# Patient Record
Sex: Female | Born: 1995 | Race: Black or African American | Hispanic: No | Marital: Single
Health system: Southern US, Community
[De-identification: ages and names within clinical notes are randomized; demographics above are authoritative.]

---

## 2018-05-04 ENCOUNTER — Emergency Department (HOSPITAL_COMMUNITY): Payer: BLUE CROSS/BLUE SHIELD

## 2018-05-04 ENCOUNTER — Emergency Department (HOSPITAL_COMMUNITY)
Admission: EM | Admit: 2018-05-04 | Discharge: 2018-05-04 | Disposition: A | Discharge status: Home / Self Care | Payer: BLUE CROSS/BLUE SHIELD | Attending: Emergency Medicine | Admitting: Emergency Medicine

## 2018-05-04 ENCOUNTER — Encounter (HOSPITAL_COMMUNITY): Payer: Self-pay

## 2018-05-04 DIAGNOSIS — Y999 Unspecified external cause status: Secondary | ICD-10-CM | POA: Diagnosis not present

## 2018-05-04 DIAGNOSIS — Y93B3 Activity, free weights: Secondary | ICD-10-CM | POA: Insufficient documentation

## 2018-05-04 DIAGNOSIS — W208XXA Other cause of strike by thrown, projected or falling object, initial encounter: Secondary | ICD-10-CM | POA: Diagnosis not present

## 2018-05-04 DIAGNOSIS — S62653A Nondisplaced fracture of medial phalanx of left middle finger, initial encounter for closed fracture: Secondary | ICD-10-CM

## 2018-05-04 DIAGNOSIS — Z23 Encounter for immunization: Secondary | ICD-10-CM | POA: Diagnosis not present

## 2018-05-04 DIAGNOSIS — Y9239 Other specified sports and athletic area as the place of occurrence of the external cause: Secondary | ICD-10-CM | POA: Diagnosis not present

## 2018-05-04 DIAGNOSIS — S62663A Nondisplaced fracture of distal phalanx of left middle finger, initial encounter for closed fracture: Secondary | ICD-10-CM

## 2018-05-04 DIAGNOSIS — S6992XA Unspecified injury of left wrist, hand and finger(s), initial encounter: Secondary | ICD-10-CM | POA: Diagnosis present

## 2018-05-04 MED ORDER — OXYCODONE-ACETAMINOPHEN 5-325 MG PO TABS: 2.0000 | ORAL_TABLET | ORAL | 0 refills | Status: AC | PRN

## 2018-05-04 MED ORDER — OXYCODONE-ACETAMINOPHEN 5-325 MG PO TABS
1.0000 | ORAL_TABLET | ORAL | Status: AC | PRN
  Administered 2018-05-04 (×2): 1 via ORAL
  Filled 2018-05-04 (×2): qty 1

## 2018-05-04 MED ORDER — TETANUS-DIPHTH-ACELL PERTUSSIS 5-2.5-18.5 LF-MCG/0.5 IM SUSP
0.5000 mL | Freq: Once | INTRAMUSCULAR | Status: AC
  Administered 2018-05-04: 0.5 mL via INTRAMUSCULAR
  Filled 2018-05-04: qty 0.5

## 2018-05-04 NOTE — ED Notes (Signed)
Patient verbalizes understanding of discharge instructions. Opportunity for questioning and answers were provided. Armband removed by staff, pt discharged from ED.  

## 2018-05-04 NOTE — Discharge Instructions (Addendum)
Please read and follow all provided instructions.  You have been seen today for for fractures to your 3rd finger.  We have placed you in a splint for the fracture, please keep the splint on at all times until you have followed up with a hand surgeon provided in your discharge instructions, please follow-up within the next 3 to 5 days.  Please follow the below instructions in the meantime.  Home care instructions: -- *PRICE in the first 24-48 hours after injury: Protect (with brace, splint, sling), if given by your provider Rest Ice- Do not apply ice pack directly to your skin, place towel or similar between your skin and ice/ice pack. Apply ice for 20 min, then remove for 40 min while awake Compression- Wear brace, elastic bandage, splint as directed by your provider Elevate affected extremity above the level of your heart when not walking around for the first 24-48 hours   Please also keep your wound areas clean.  Medications:  Take ibuprofen 600 mg every 6 hours as needed for pain/swelling.  Your pain is not alleviated by the ibuprofen we have given you a prescription for Percocet. -Percocet-this is a narcotic/controlled substance medication that has potential addicting qualities.  We recommend that you take 1-2 tablets every 6 hours as needed for severe pain.  Do not drive or operate heavy machinery when taking this medicine as it can be sedating. Do not drink alcohol or take other sedating medications when taking this medicine for safety reasons.  Keep this out of reach of small children.  Please be aware this medicine has Tylenol in it (325 mg/tab) do not exceed the maximum dose of Tylenol in a day per over the counter recommendations should you decide to supplement with Tylenol over the counter.   We have prescribed you new medication(s) today. Discuss the medications prescribed today with your pharmacist as they can have adverse effects and interactions with your other medicines including  over the counter and prescribed medications. Seek medical evaluation if you start to experience new or abnormal symptoms after taking one of these medicines, seek care immediately if you start to experience difficulty breathing, feeling of your throat closing, facial swelling, or rash as these could be indications of a more serious allergic reaction   Follow-up instructions: Please follow-up with hand surgery within 3 to 5 days.  Return instructions:  Please return if your digits or extremity are numb or tingling, appear gray or blue, or you have severe pain (also elevate the extremity and loosen splint or wrap if you were given one) Please return if you have redness or fevers.  Please return to the Emergency Department if you experience worsening symptoms.  Please return if you have any other emergent concerns. Additional Information:  Your vital signs today were: BP (!) 122/93 (BP Location: Left Arm)    Pulse 68    Temp 98.6 F (37 C) (Oral)    Resp 14    LMP 04/05/2018 (Within Days)    SpO2 100%  If your blood pressure (BP) was elevated above 135/85 this visit, please have this repeated by your doctor within one month. ---------------

## 2018-05-04 NOTE — ED Provider Notes (Addendum)
[facility] EMERGENCY DEPARTMENT Provider Note   CSN: [id number] Arrival date & time: 05/04/18  1859     History   Chief Complaint Chief Complaint  Patient presents with  . Hand Injury    HPI Kaitlyn Green is a 23 y.o. female without significant past medical history who presents to the emergency department status post left hand injury which occurred shortly prior to arrival.  Patient states she was at the gym had an issue with 1 of the weight machines and some weights came down and slammed her third and fourth fingers.  She states that she is having pain specifically to the third and fourth digits, pain is now a 6 out of 10, improved with Percocet provided by triage team.  Pain is worse with movement.  She did break off her third digit acrylic nail and cracked the fourth digit acrylic nail.  She does not think that it bled much.  Denies numbness, tingling, or weakness.  Denies other areas of injury.  Unknown last tetanus. She is R hand dominant.   HPI  History reviewed. No pertinent past medical history.  There are no active problems to display for this patient.   History reviewed. No pertinent surgical history.   OB History   No obstetric history on file.      Home Medications    Prior to Admission medications   Not on File    Family History No family history on file.  Social History Social History   Tobacco Use  . Smoking status: Not on file  Substance Use Topics  . Alcohol use: Not on file  . Drug use: Not on file     Allergies   Patient has no allergy information on record.   Review of Systems Review of Systems  Constitutional: Negative for chills and fever.  Musculoskeletal: Positive for arthralgias (3rd/4th L fingers).  Skin: Positive for wound.  Neurological: Negative for weakness and numbness.     Physical Exam Updated Vital Signs BP (!) 122/93 (BP Location: Left Arm)   Pulse 68   Temp 98.6 F (37 C) (Oral)   Resp 14    LMP 04/05/2018 (Within Days)   SpO2 100%   Physical Exam Vitals signs and nursing note reviewed.  Constitutional:      General: She is not in acute distress.    Appearance: She is well-developed.  HENT:     Head: Normocephalic and atraumatic.  Eyes:     General:        Right eye: No discharge.        Left eye: No discharge.     Conjunctiva/sclera: Conjunctivae normal.  Cardiovascular:     Comments: 2+ symmetric radial pulses. Musculoskeletal:     Comments: Left upper extremity: Patient has a superficial abrasion to the dorsal radial aspect of the third DIP joint.  This is very superficial and approximately 2 mm in size.  No active bleeding.  The fourth digit acrylic nail is cracked through to the base, there is a mild amount of blood within the crevices.  No other notable active bleeding.  I will examining the patient she did take off the nail on her own.  I then better visualized the digit, there does not appear to be a break in the skin that is appreciable on exam.  The nailbed appears intact as well as the patients actual nail.  No significant lacerations or wounds.  She has some mild soft tissue swellings to the  3rd/4th digits. Patient has intact range of motion to all IP/MCP joints as well as the wrist with the exception of the third and fourth DIP and PIP joints.  She is able to flex and extend against resistance at all of these joints, however limited in all directions secondary to pain.  She is tender over the fourth and fifth MCP, proximal phalanx, PIP joint, middle phalanx, DIP joints, and distal phalanxes.  No other areas of tenderness noted.  No anatomical snuffbox tenderness.  Neurovascularly intact distally with good cap refill.  Skin:    Capillary Refill: Capillary refill takes less than 2 seconds.  Neurological:     Mental Status: She is alert.     Comments: Clear speech.  5 out of 5 symmetric grip strength.  Sensation grossly intact bilateral upper extremities.  Patient is  able to perform okay sign, thumbs up, and cross second and third digits.  Psychiatric:        Behavior: Behavior normal.        Thought Content: Thought content normal.          ED Treatments / Results  Labs (all labs ordered are listed, but only abnormal results are displayed) Labs Reviewed - No data to display  EKG None  Radiology Dg Hand Complete Left  Result Date: 05/04/2018 CLINICAL DATA:  23 year old female status post blunt trauma when weights at gym fell on hand today. EXAM: LEFT HAND - COMPLETE 3+ VIEW COMPARISON:  None. FINDINGS: Comminuted longitudinal fracture of the left 3rd distal phalanx with no significant displacement. Superimposed oblique, transverse but nondisplaced fracture through the head of the 3rd middle phalanx. The 3rd DIP joint space and alignment are maintained. Regional soft tissue swelling. Other left hand osseous structures appear intact. IMPRESSION: Nondisplaced fractures of the left 3rd distal and middle phalanges, but the intervening 3rd DIP alignment remains normal. Electronically Signed   By: Odessa Fleming M.D.   On: 05/04/2018 19:46    Procedures Procedures (including critical care time)  SPLINT APPLICATION Date/Time: 9:16 PM Authorized by: Harvie Heck Consent: Verbal consent obtained. Risks and benefits: risks, benefits and alternatives were discussed Consent given by: patient Splint applied by: RN Location details: L 3rd finger Splint type: aluminum static splint Post-procedure: The splinted body part was neurovascularly unchanged following the procedure. Patient tolerance: Patient tolerated the procedure well with no immediate complications.  Medications Ordered in ED Medications  oxyCODONE-acetaminophen (PERCOCET/ROXICET) 5-325 MG per tablet 1 tablet (1 tablet Oral Given 05/04/18 1919)  Tdap (BOOSTRIX) injection 0.5 mL (has no administration in time range)     Initial Impression / Assessment and Plan / ED Course  I have reviewed  the triage vital signs and the nursing notes.  Pertinent labs & imaging results that were available during my care of the patient were reviewed by me and considered in my medical decision making (see chart for details).   Patient presents to the emergency department status post left third/fourth digit injury which occurred shortly prior to arrival.  X-ray per triage confirms nondisplaced fractures of the left third distal and middle phalanges with the intervening third DIP alignment remaining normal.  On exam she does have an overlying superficial abrasion to the dorsum of the DIP joint of this finger, this is not appear deep, not consistent with open fracture.  She also has a damaged acrylic nail of the left fourth digit that was trimmed back by the patient and does not reveal significant nail bed injury or other significant  open wounds. Will have her be sure to maintain good hygiene to avoid infection. She has some limitation in IP joint flexion/extension to the third and fourth digits, however she is able to perform each of these against resistance making tendon injury less likely.  Neurovascularly intact distally. TDAP updated. Discussed with Dr. Madilyn Hookees- does not recommend abx prophylaxis which I am in agreement with. Discharge home with pain control, PRICE, and hand surgery follow up. I discussed results, treatment plan, need for follow-up, and return precautions with the patient. Provided opportunity for questions, patient confirmed understanding and is in agreement with plan.   Findings and plan of care discussed with supervising physician Dr. Madilyn Hookees who is in agreement.   Final Clinical Impressions(s) / ED Diagnoses   Final diagnoses:  Closed nondisplaced fracture of middle phalanx of left middle finger, initial encounter  Closed nondisplaced fracture of distal phalanx of left middle finger, initial encounter    ED Discharge Orders         Ordered    oxyCODONE-acetaminophen (PERCOCET/ROXICET)  5-325 MG tablet  Every 4 hours PRN     05/04/18 2120           Cherly Andersonetrucelli, Zyia Kaneko R, PA-C 05/04/18 2125    Cherly Andersonetrucelli, Tamani Durney R, PA-C 05/04/18 2258    Tilden Fossaees, Elizabeth, MD 05/07/18 1025

## 2018-05-04 NOTE — ED Triage Notes (Addendum)
Just PTA pt slammed her LEFT hand in a piece of equipment at the gym. Pt unable to open hand. No bleeding at this time.

## 2020-06-12 IMAGING — DX DG HAND COMPLETE 3+V*L*
3 series · 3 of 3 positions shown · non-contrast
Comparison: None.

CLINICAL DATA: 22-year-old female status post blunt trauma when
weights at gym fell on hand today.

EXAM:
LEFT HAND - COMPLETE 3+ VIEW

[hand pa]
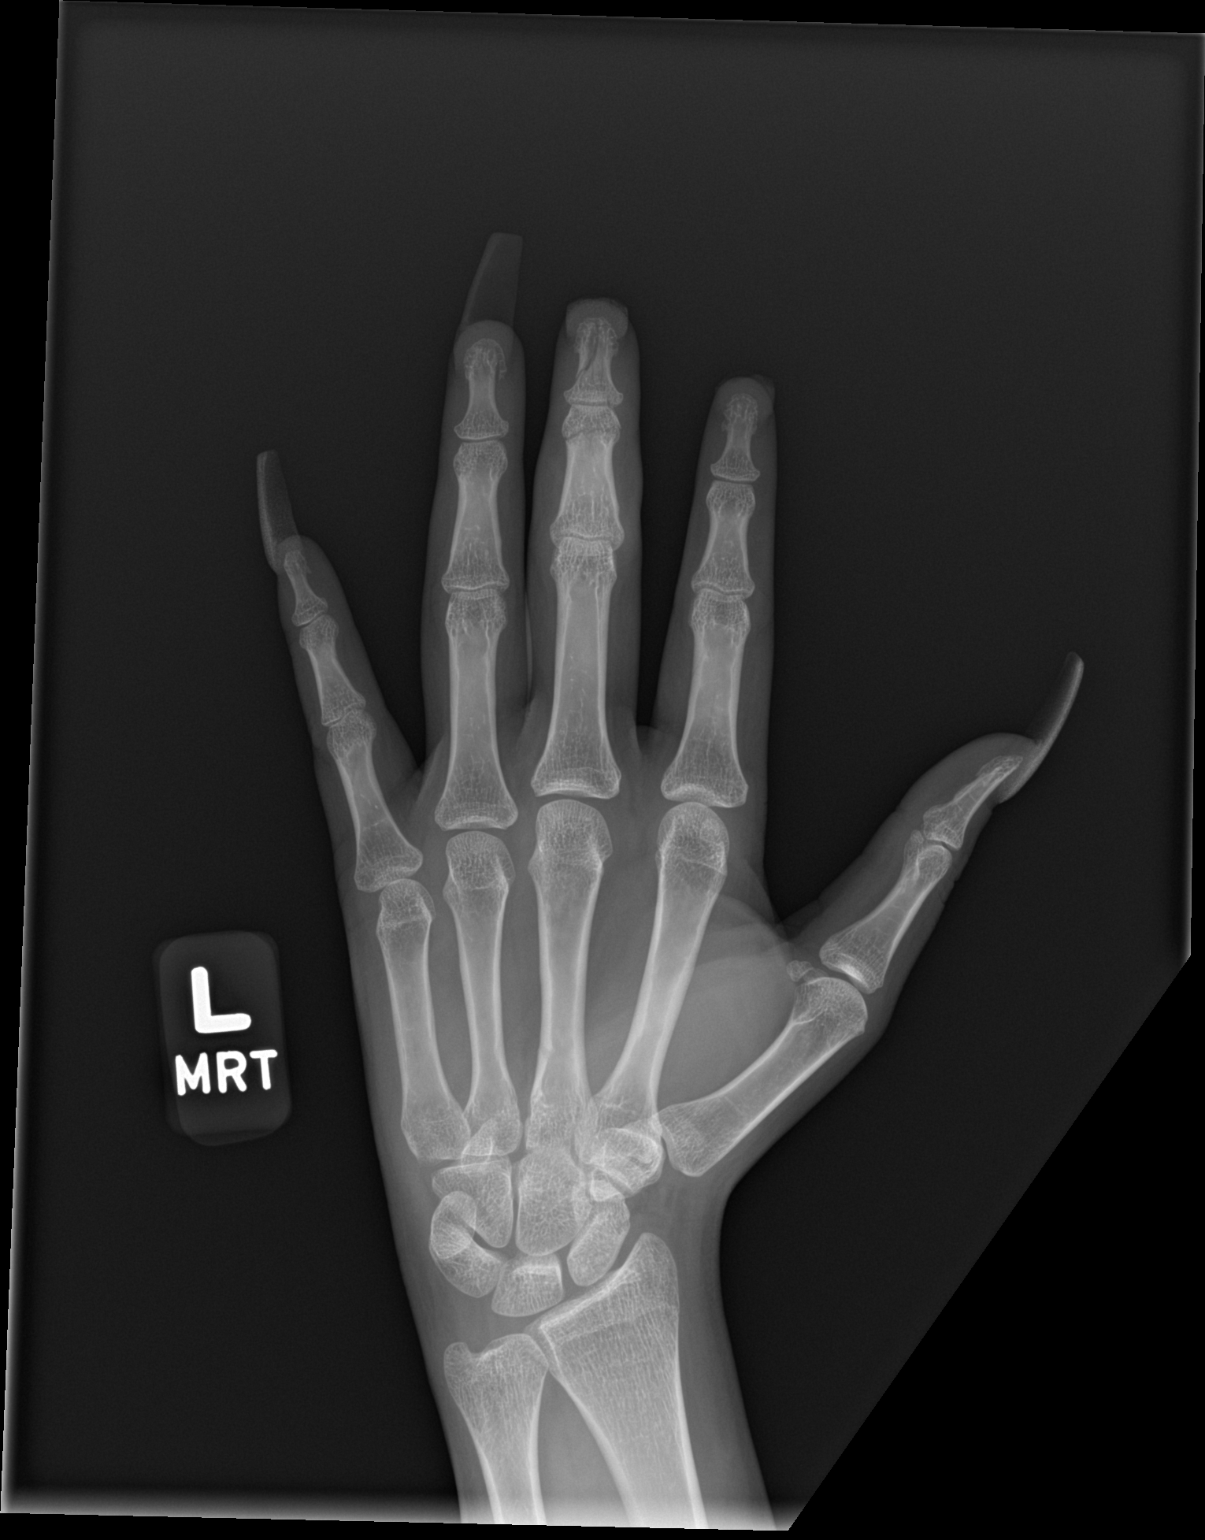

[hand obl]
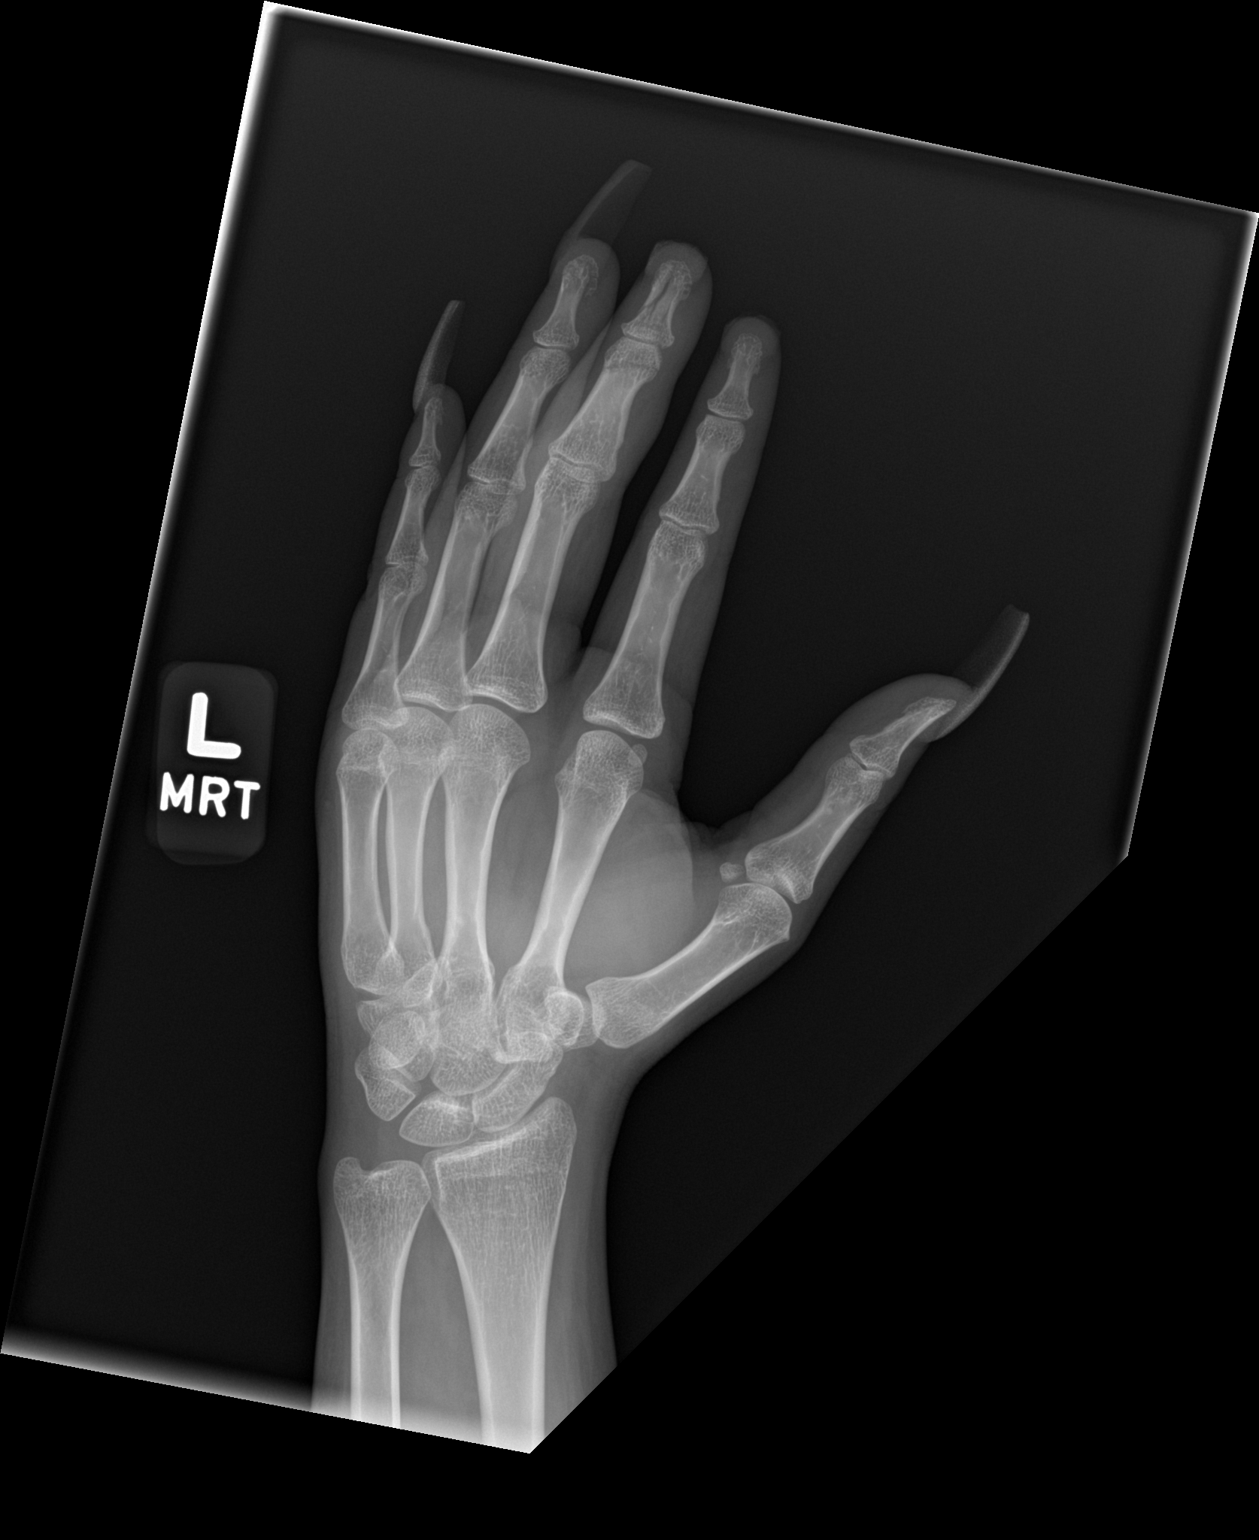

[hand lat]
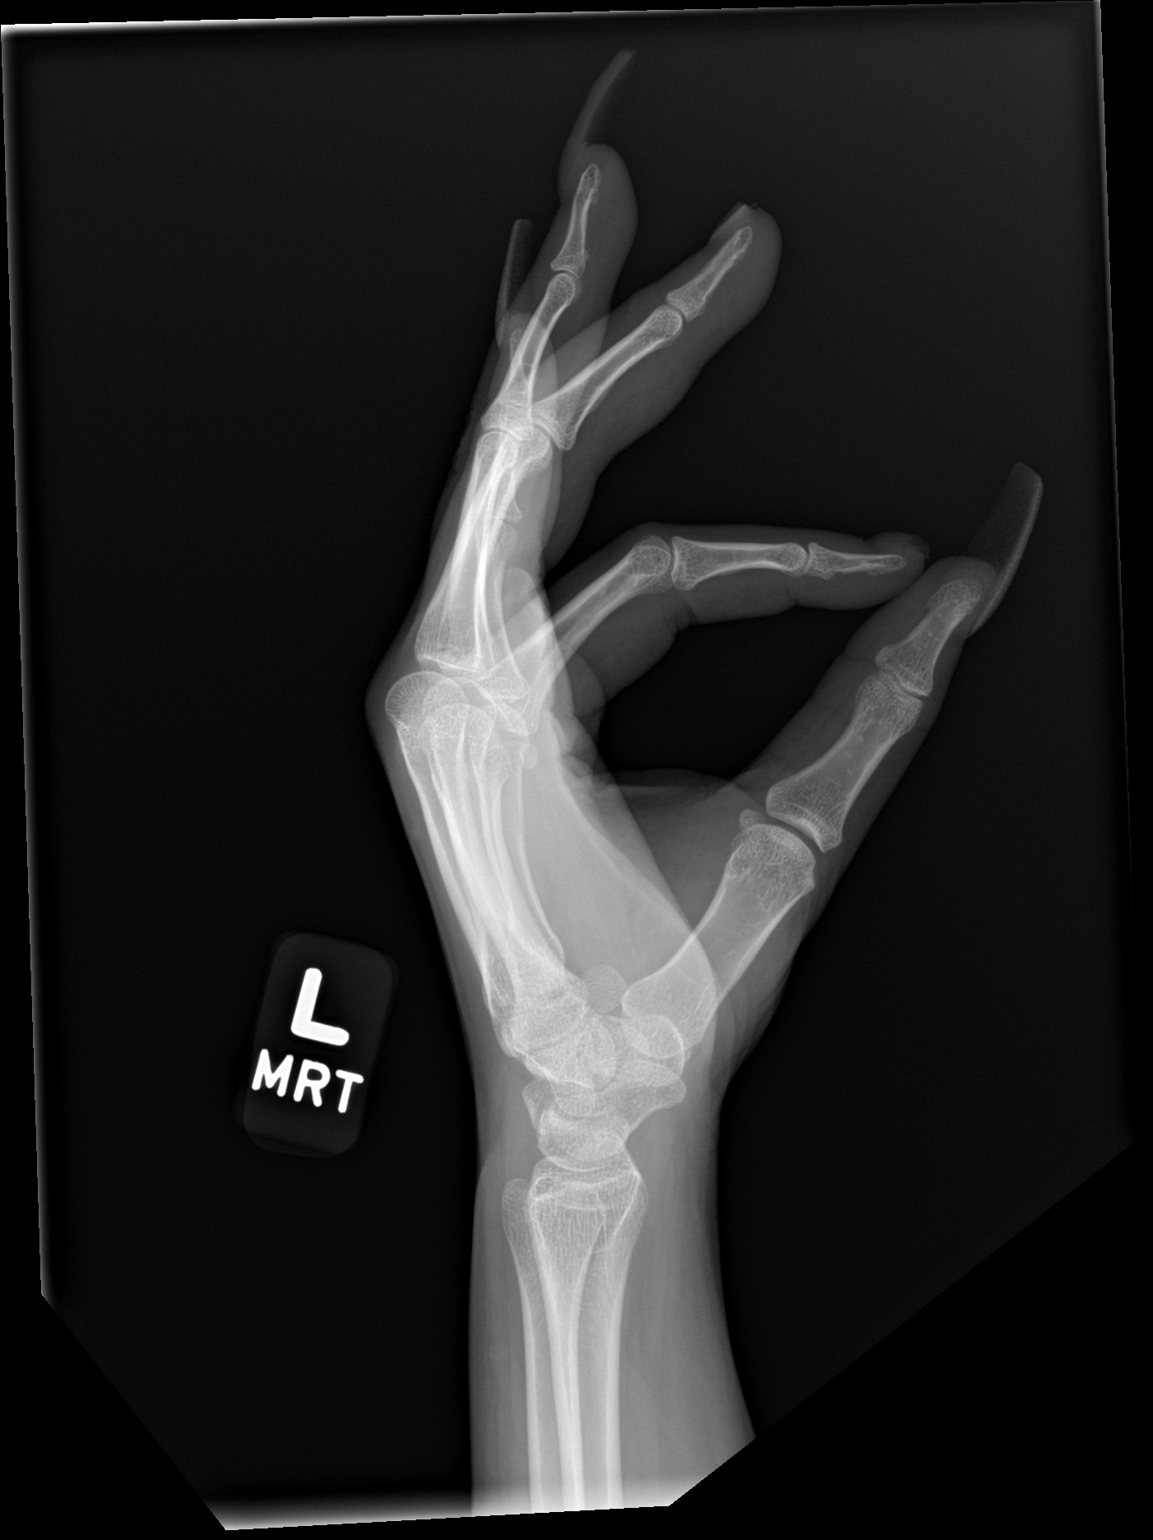

[3 of 3 positions shown; findings below may reference images not displayed]

FINDINGS: Comminuted longitudinal fracture of the left 3rd distal phalanx with
no significant displacement. Superimposed oblique, transverse but
nondisplaced fracture through the head of the 3rd middle phalanx.
The 3rd DIP joint space and alignment are maintained. Regional soft
tissue swelling.

Other left hand osseous structures appear intact.
IMPRESSION: Nondisplaced fractures of the left 3rd distal and middle phalanges,
but the intervening 3rd DIP alignment remains normal.
